# Patient Record
Sex: Male | Born: 1995 | Race: White | Hispanic: No | Marital: Single | Smoking: Never smoker
Health system: Southern US, Community
[De-identification: ages and names within clinical notes are randomized; demographics above are authoritative.]

## PROBLEM LIST (undated history)

## (undated) DIAGNOSIS — A389 Scarlet fever, uncomplicated: Secondary | ICD-10-CM

## (undated) HISTORY — DX: Scarlet fever, uncomplicated: A38.9

---
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
## 2018-05-31 ENCOUNTER — Encounter: Payer: Self-pay | Admitting: *Deleted

## 2018-05-31 ENCOUNTER — Other Ambulatory Visit: Payer: Self-pay

## 2018-05-31 ENCOUNTER — Emergency Department: Payer: No Typology Code available for payment source

## 2018-05-31 ENCOUNTER — Emergency Department
Admission: EM | Admit: 2018-05-31 | Discharge: 2018-05-31 | Disposition: A | Discharge status: Home / Self Care | Payer: No Typology Code available for payment source | Attending: Student in an Organized Health Care Education/Training Program | Admitting: Student in an Organized Health Care Education/Training Program

## 2018-05-31 DIAGNOSIS — T754XXA Electrocution, initial encounter: Secondary | ICD-10-CM

## 2018-05-31 DIAGNOSIS — W868XXA Exposure to other electric current, initial encounter: Secondary | ICD-10-CM | POA: Diagnosis not present

## 2018-05-31 DIAGNOSIS — Y990 Civilian activity done for income or pay: Secondary | ICD-10-CM | POA: Diagnosis not present

## 2018-05-31 DIAGNOSIS — Y9289 Other specified places as the place of occurrence of the external cause: Secondary | ICD-10-CM | POA: Insufficient documentation

## 2018-05-31 DIAGNOSIS — Y9389 Activity, other specified: Secondary | ICD-10-CM | POA: Insufficient documentation

## 2018-05-31 LAB — CBC
HCT: 45.1 % (ref 40.0–52.0)
HEMOGLOBIN: 15.6 g/dL (ref 13.0–18.0)
MCH: 30.6 pg (ref 26.0–34.0)
MCHC: 34.7 g/dL (ref 32.0–36.0)
MCV: 88.2 fL (ref 80.0–100.0)
PLATELETS: 343 10*3/uL (ref 150–440)
RBC: 5.12 MIL/uL (ref 4.40–5.90)
RDW: 12.5 % (ref 11.5–14.5)
WBC: 11.9 10*3/uL — ABNORMAL HIGH (ref 3.8–10.6)

## 2018-05-31 LAB — BASIC METABOLIC PANEL
ANION GAP: 7 (ref 5–15)
BUN: 15 mg/dL (ref 6–20)
CALCIUM: 9.4 mg/dL (ref 8.9–10.3)
CO2: 27 mmol/L (ref 22–32)
CREATININE: 0.86 mg/dL (ref 0.61–1.24)
Chloride: 106 mmol/L (ref 98–111)
Glucose, Bld: 115 mg/dL — ABNORMAL HIGH (ref 70–99)
Potassium: 4 mmol/L (ref 3.5–5.1)
Sodium: 140 mmol/L (ref 135–145)

## 2018-05-31 LAB — TROPONIN I

## 2018-05-31 NOTE — ED Provider Notes (Signed)
[facility] Emergency Department Provider Note    First MD Initiated Contact with Patient 05/31/18 1601     (approximate)  I have reviewed the triage vital signs and the nursing notes.   HISTORY  Chief Complaint Electric Shock    HPI Ernest Benitez is a 22 y.o. male he sustained a shock injury while completing the surgical tractor under his workstation at work today.  States it was not working and it was plugged to him but the switch was also employed and felt a shocking sensation to his right hand.  He did not lose consciousness.  States he did have some chest pain after this but denies any symptoms at this time.  Has also had some intermittent tingling in his left arm.  Denies any history of heart disease.  No numbness or tingling.  No head injury.  No nausea or vomiting.  No past medical history on file. No family history on file.  There are no active problems to display for this patient.     Prior to Admission medications   Not on File    Allergies Patient has no known allergies.    Social History Social History   Tobacco Use  . Smoking status: Never Smoker  . Smokeless tobacco: Never Used  Substance Use Topics  . Alcohol use: Yes  . Drug use: Never    Review of Systems Patient denies headaches, rhinorrhea, blurry vision, numbness, shortness of breath, chest pain, edema, cough, abdominal pain, nausea, vomiting, diarrhea, dysuria, fevers, rashes or hallucinations unless otherwise stated above in HPI. ____________________________________________   PHYSICAL EXAM:  VITAL SIGNS: Vitals:   05/31/18 1518  BP: (!) 146/81  Pulse: 100  Resp: 18  Temp: 98.8 F (37.1 C)  SpO2: 96%    Constitutional: Alert and oriented. Well appearing and in no acute distress. Eyes: Conjunctivae are normal.  Head: Atraumatic. Nose: No congestion/rhinnorhea. Mouth/Throat: Mucous membranes are moist.   Neck: Painless ROM.  Cardiovascular:   Good  peripheral circulation. Respiratory: Normal respiratory effort.  No retractions.  Gastrointestinal: Soft and nontender.  Musculoskeletal: No lower extremity tenderness .  No joint effusions. Neurologic:  Normal speech and language. No gross focal neurologic deficits are appreciated.  Skin:  Skin is warm, dry and intact. No rash noted. Psychiatric: Mood and affect are normal. Speech and behavior are normal. ____________________________________________   LABS (all labs ordered are listed, but only abnormal results are displayed)  Results for orders placed or performed during the hospital encounter of 05/31/18 (from the past 24 hour(s))  Basic metabolic panel     Status: Abnormal   Collection Time: 05/31/18  3:18 PM  Result Value Ref Range   Sodium 140 135 - 145 mmol/L   Potassium 4.0 3.5 - 5.1 mmol/L   Chloride 106 98 - 111 mmol/L   CO2 27 22 - 32 mmol/L   Glucose, Bld 115 (H) 70 - 99 mg/dL   BUN 15 6 - 20 mg/dL   Creatinine, Ser 2.950.86 0.61 - 1.24 mg/dL   Calcium 9.4 8.9 - 62.110.3 mg/dL   GFR calc non Af Amer >60 >60 mL/min   GFR calc Af Amer >60 >60 mL/min   Anion gap 7 5 - 15  CBC     Status: Abnormal   Collection Time: 05/31/18  3:18 PM  Result Value Ref Range   WBC 11.9 (H) 3.8 - 10.6 K/uL   RBC 5.12 4.40 - 5.90 MIL/uL   Hemoglobin 15.6 13.0 - 18.0  g/dL   HCT 16.1 09.6 - 04.5 %   MCV 88.2 80.0 - 100.0 fL   MCH 30.6 26.0 - 34.0 pg   MCHC 34.7 32.0 - 36.0 g/dL   RDW 40.9 81.1 - 91.4 %   Platelets 343 150 - 440 K/uL  Troponin I     Status: None   Collection Time: 05/31/18  3:18 PM  Result Value Ref Range   Troponin I <0.03 <0.03 ng/mL   ____________________________________________  EKG My review and personal interpretation at Time: 15:20   Indication: electrical injury  Rate: 100  Rhythm: sinus Axis: normal Other: normal intervals, no stemi ____________________________________________  RADIOLOGY  I personally reviewed all radiographic images ordered to evaluate for  the above acute complaints and reviewed radiology reports and findings.  These findings were personally discussed with the patient.  Please see medical record for radiology report.  ____________________________________________   PROCEDURES  Procedure(s) performed:  Procedures    Critical Care performed: no ____________________________________________   INITIAL IMPRESSION / ASSESSMENT AND PLAN / ED COURSE  Pertinent labs & imaging results that were available during my care of the patient were reviewed by me and considered in my medical decision making (see chart for details).  DDX: Atrial injury, dysrhythmia, fracture, electrolyte abnormality  Ernest Benitez is a 22 y.o. who presents to the ED with electrical injury as described above.  Patient well-appearing at this time.  EKG shows no evidence of preexcitation syndrome or dysrhythmia.  Blood work is reassuring.  Patient stable and appropriate for outpatient follow-up.      ____________________________________________   FINAL CLINICAL IMPRESSION(S) / ED DIAGNOSES  Final diagnoses:  Electrical injury in adult      NEW MEDICATIONS STARTED DURING THIS VISIT:  New Prescriptions   No medications on file     Note:  This document was prepared using Dragon voice recognition software and may include unintentional dictation errors.     Willy Eddy, MD 05/31/18 1630

## 2018-05-31 NOTE — ED Notes (Signed)
Spoke with supervisor Leontine LocketJenny Allred at [employer] who states that no additional testing required to file workers comp.

## 2018-05-31 NOTE — ED Triage Notes (Signed)
Pt states he was shocked when turning off a power strip at work at noon today.  Pt was shocked in right hand.  Reports now chest pain.  No n/v  Pt alert.  Speech clear.

## 2018-05-31 NOTE — ED Notes (Signed)
Pt alert and oriented X4, active, cooperative, pt in NAD. RR even and unlabored, color WNL.  Pt informed to return if any life threatening symptoms occur.  Discharge and followup instructions reviewed.  

## 2019-02-06 IMAGING — CR DG CHEST 2V
1 series · 2 of 2 positions shown · non-contrast
Comparison: None.

CLINICAL DATA: Chest pain following electrical shock

EXAM:
CHEST - 2 VIEW

[Series 1: dg chest 2 view · 0.14mm/px · 2 of 2 slices shown]
[im 1/2]
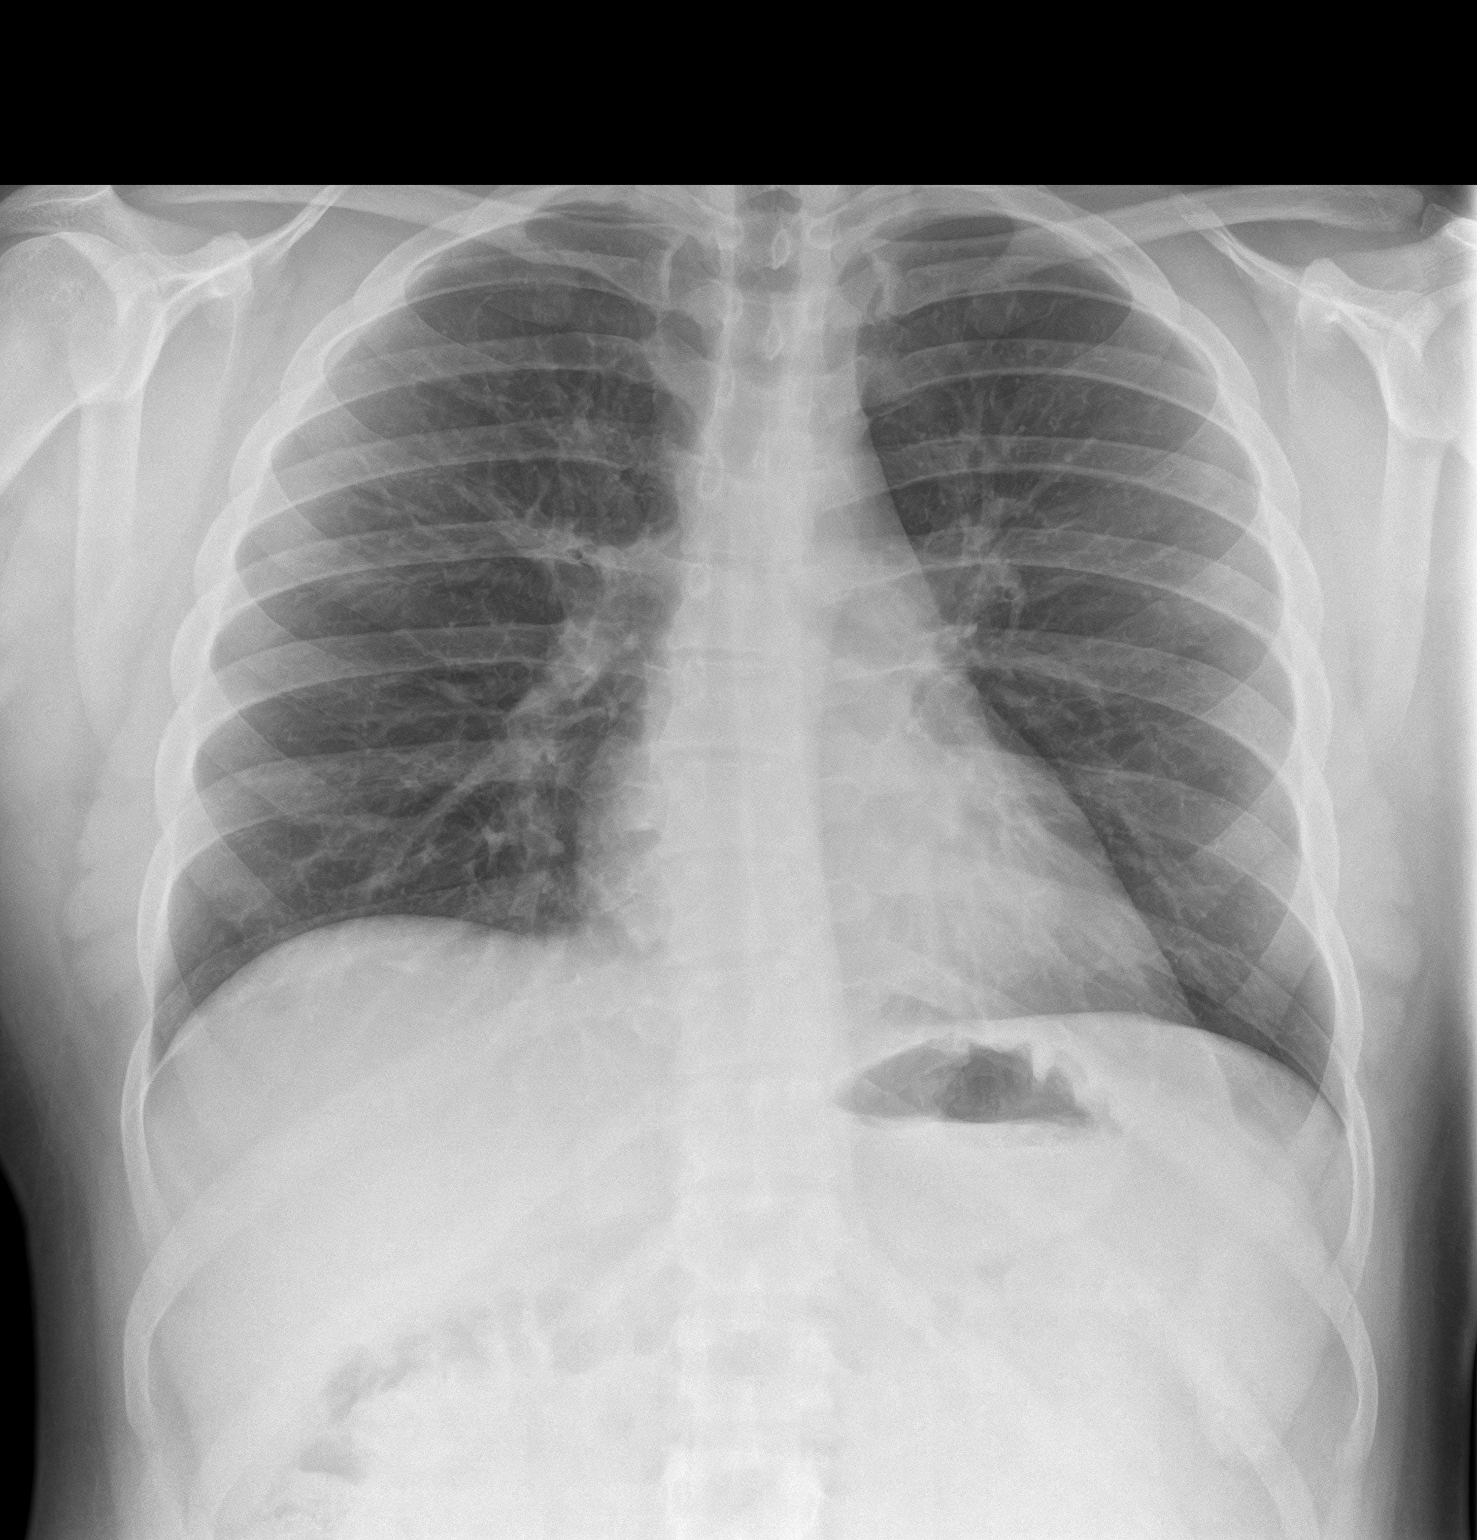
[im 2/2]
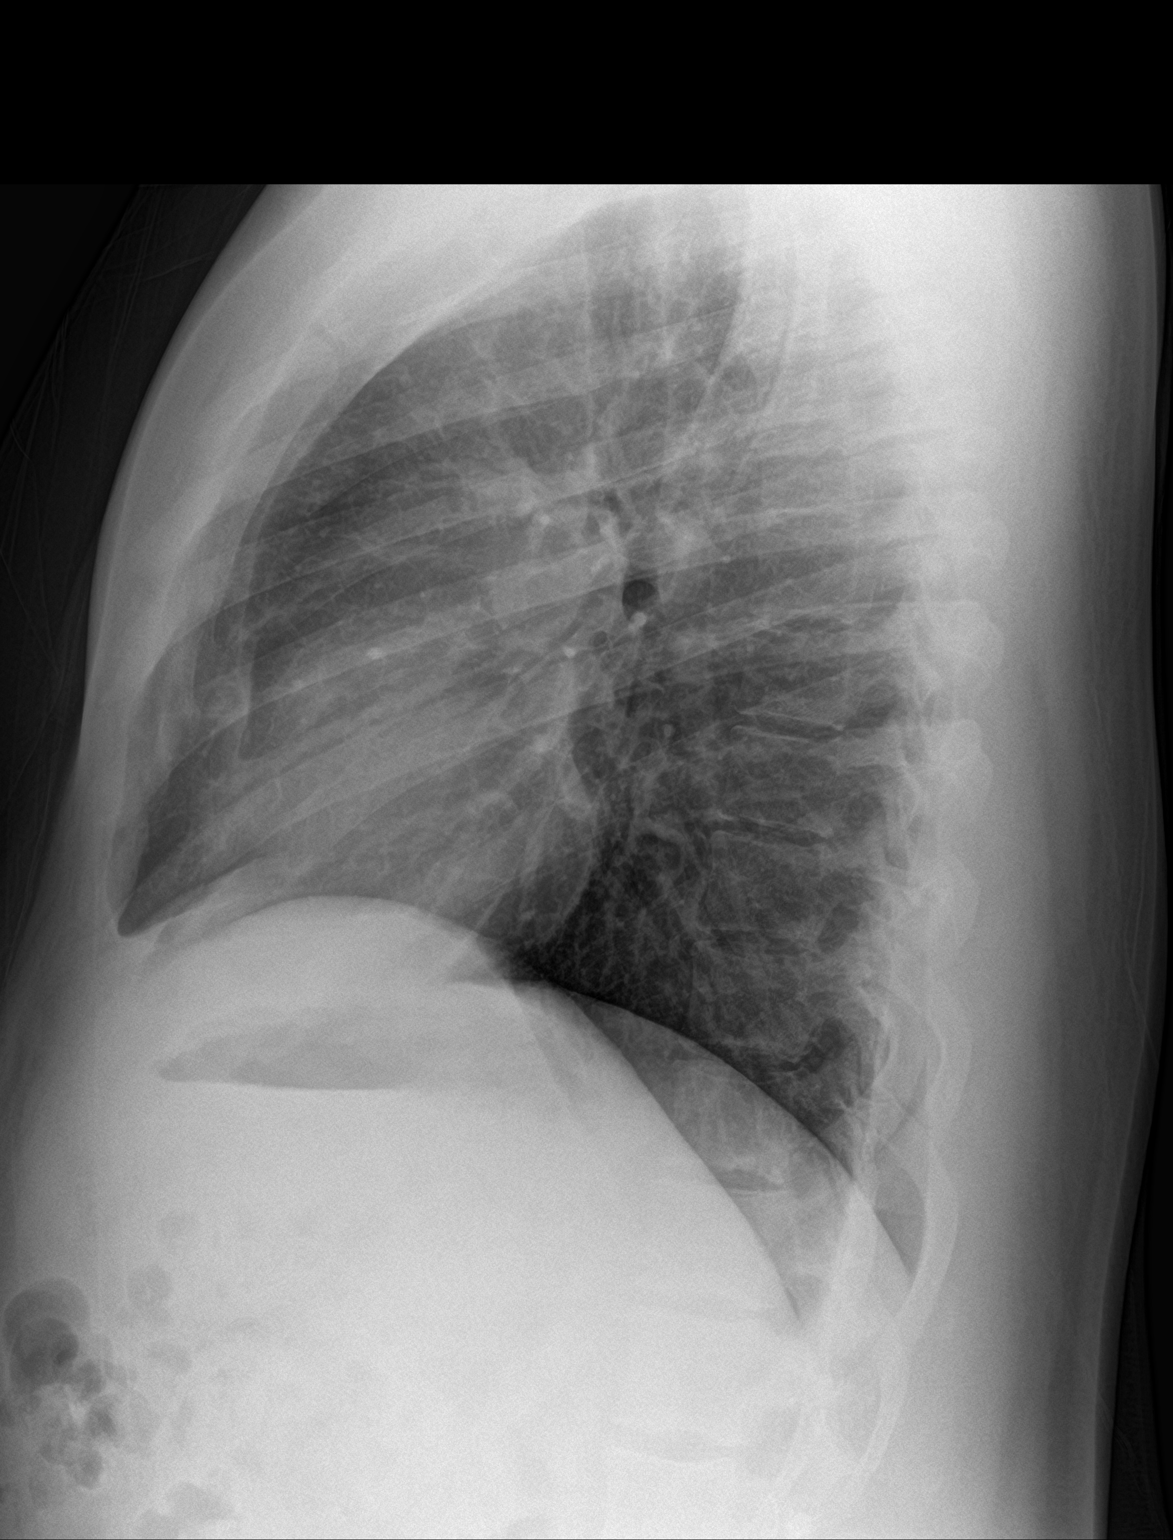

[2 of 2 positions shown; findings below may reference images not displayed]

FINDINGS: The heart size and mediastinal contours are within normal limits.
Both lungs are clear. The visualized skeletal structures are
unremarkable.
IMPRESSION: No acute abnormality noted.

## 2023-02-04 ENCOUNTER — Ambulatory Visit: Payer: Self-pay | Admitting: *Deleted

## 2023-02-04 NOTE — Telephone Encounter (Signed)
Spoke with patient and made an NP appt

## 2023-02-04 NOTE — Telephone Encounter (Signed)
Chief Complaint: abdominal pain requesting new patient appt Symptoms: abdominal pain sometimes severe with flare ups lasting 10 hours. Reports low abdominal pain  up to chest at times and sides. No normal BM x 2 years. Mostly loose. Mucus or like "gelatin"noted with wiping. No blood reported. Reports "foamy" at times. Pain can last 30 sec and come and go and causes severe pain doubled over. Did not report severe pain at this time. Some pain with urination at times.  Frequency: 2 years Pertinent Negatives: Patient denies chest pain no difficulty breathing reported no fever.  Disposition: '[x]'$ ED /'[]'$ Urgent Care (no appt availability in office) / '[]'$ Appointment(In office/virtual)/ '[]'$  [place] [facility]/ '[]'$ Home Care/ '[]'$ Refused Recommended Disposition /'[]'$ [facility]/ '[]'$  Follow-up with PCP Additional Notes:   Recommended ED due to no PCP and sx worsening. Transferred to [place] to assist patient with new patient scheduling appt.       Reason for Disposition  [1] MODERATE pain (e.g., interferes with normal activities) AND [2] pain comes and goes (cramps) AND [3] present > 24 hours  (Exception: Pain with Vomiting or Diarrhea - see that Guideline.)  Answer Assessment - Initial Assessment Questions 1. LOCATION: "Where does it hurt?"      Low abdominal area up to chest  2. RADIATION: "Does the pain shoot anywhere else?" (e.g., chest, back)     Sides and chest  3. ONSET: "When did the pain begin?" (Minutes, hours or days ago)      Started 2 years ago  4. SUDDEN: "Gradual or sudden onset?"     Worsening over time 5. PATTERN "Does the pain come and go, or is it constant?"    - If it comes and goes: "How long does it last?" "Do you have pain now?"     (Note: Comes and goes means the pain is intermittent. It goes away completely between bouts.)    - If constant: "Is it getting better, staying the same, or getting worse?"      (Note: Constant means the pain never goes away completely;  most serious pain is constant and gets worse.)      Happens everyday and flares up during the day comes and goes  6. SEVERITY: "How bad is the pain?"  (e.g., Scale 1-10; mild, moderate, or severe)    - MILD (1-3): Doesn't interfere with normal activities, abdomen soft and not tender to touch.     - MODERATE (4-7): Interferes with normal activities or awakens from sleep, abdomen tender to touch.     - SEVERE (8-10): Excruciating pain, doubled over, unable to do any normal activities.       Causes severe pain and flares up lasting 30 seconds or longer before going away. 7. RECURRENT SYMPTOM: "Have you ever had this type of stomach pain before?" If Yes, ask: "When was the last time?" and "What happened that time?"      Yes started 2 years ago has not been assessed for pain 8. CAUSE: "What do you think is causing the stomach pain?"     Not sure  9. RELIEVING/AGGRAVATING FACTORS: "What makes it better or worse?" (e.g., antacids, bending or twisting motion, bowel movement)     na 10. OTHER SYMPTOMS: "Do you have any other symptoms?" (e.g., back pain, diarrhea, fever, urination pain, vomiting)       Side pain some pain with urination nausea in am while using bathroom for BM no solid BMs within the last few months.  Noted mucus or  like gelatin when wiping foamy at times. No bleeding reported  Protocols used: Abdominal Pain - Male-A-AH

## 2023-03-23 NOTE — Progress Notes (Signed)
BP 112/72   Pulse 87   Temp 98.1 F (36.7 C) (Oral)   Resp 18   Ht 5\' 5"  (1.651 m)   Wt 194 lb 8 oz (88.2 kg)   SpO2 99%   BMI 32.37 kg/m    Subjective:    Patient ID: Ernest Benitez, male    DOB: [birth year], 27 y.o.   MRN: [id number]  HPI: Ernest Benitez is a 27 y.o. male  Chief Complaint  Patient presents with   Establish Care   Abdominal Pain    On and off for 3 years   Establish care: his last physical was 8 years ago.  Medical history includes none.  Family history includes cardiac issues.  Health Maintenance due for labs.   Abdominal pain:he says that he has been having abdominal pain about every morning for a few years, he also reports nausea and bms diarrhea with bristol scale 5-7.  He reports the pain feels like cramping and sharp pain.  He says it lasts 30-40 minutes.  He says that he will have three or four trips to the bathroom.  He says the rest of the day he is fine.  He reports his anxiety level is normal.  Will get stool sample, labs, start bentyl and zofran, referral placed to GI.      03/24/2023    2:33 PM  Depression screen PHQ 2/9  Decreased Interest 0  Down, Depressed, Hopeless 0  PHQ - 2 Score 0     Relevant past medical, surgical, family and social history reviewed and updated as indicated. Interim medical history since our last visit reviewed. Allergies and medications reviewed and updated.  Review of Systems  Constitutional: Negative for fever or weight change.  Respiratory: Negative for cough and shortness of breath.   Cardiovascular: Negative for chest pain or palpitations.  Gastrointestinal: positive for abdominal pain, diarrhea Musculoskeletal: Negative for gait problem or joint swelling.  Skin: Negative for rash.  Neurological: Negative for dizziness or headache.  No other specific complaints in a complete review of systems (except as listed in HPI above).      Objective:    BP 112/72   Pulse 87   Temp 98.1 F (36.7 C) (Oral)   Resp 18    Ht 5\' 5"  (1.651 m)   Wt 194 lb 8 oz (88.2 kg)   SpO2 99%   BMI 32.37 kg/m   Wt Readings from Last 3 Encounters:  03/24/23 194 lb 8 oz (88.2 kg)  05/31/18 208 lb (94.3 kg)    Physical Exam  Constitutional: Patient appears well-developed and well-nourished. Obese  No distress.  HEENT: head atraumatic, normocephalic, pupils equal and reactive to light, neck supple Cardiovascular: Normal rate, regular rhythm and normal heart sounds.  No murmur heard. No BLE edema. Pulmonary/Chest: Effort normal and breath sounds normal. No respiratory distress. Abdominal: Soft.  There is no tenderness. Psychiatric: Patient has a normal mood and affect. behavior is normal. Judgment and thought content normal.  Results for orders placed or performed during the hospital encounter of 05/31/18  Basic metabolic panel  Result Value Ref Range   Sodium 140 135 - 145 mmol/L   Potassium 4.0 3.5 - 5.1 mmol/L   Chloride 106 98 - 111 mmol/L   CO2 27 22 - 32 mmol/L   Glucose, Bld 115 (H) 70 - 99 mg/dL   BUN 15 6 - 20 mg/dL   Creatinine, Ser 6.29 0.61 - 1.24 mg/dL   Calcium 9.4 8.9 - 10.3  mg/dL   GFR calc non Af Amer >60 >60 mL/min   GFR calc Af Amer >60 >60 mL/min   Anion gap 7 5 - 15  CBC  Result Value Ref Range   WBC 11.9 (H) 3.8 - 10.6 K/uL   RBC 5.12 4.40 - 5.90 MIL/uL   Hemoglobin 15.6 13.0 - 18.0 g/dL   HCT 40.1 02.7 - 25.3 %   MCV 88.2 80.0 - 100.0 fL   MCH 30.6 26.0 - 34.0 pg   MCHC 34.7 32.0 - 36.0 g/dL   RDW 66.4 40.3 - 47.4 %   Platelets 343 150 - 440 K/uL  Troponin I  Result Value Ref Range   Troponin I <0.03 <0.03 ng/mL      Assessment & Plan:   Problem List Items Addressed This Visit   None Visit Diagnoses     Generalized abdominal pain    -  Primary   stool cultures, labs, referral to gi, start zofran and bentyl   Relevant Medications   dicyclomine (BENTYL) 20 MG tablet   ondansetron (ZOFRAN-ODT) 4 MG disintegrating tablet   Other Relevant Orders   Lipase   Ova and parasite  examination   Stool Giardia/Cryptosporidium   Salmonella/Shigella Cult, Campy EIA and Shiga Toxin reflex   Ambulatory referral to Gastroenterology   Diarrhea, unspecified type       stool cultures, labs, referral to gi, start zofran and bentyl   Relevant Medications   dicyclomine (BENTYL) 20 MG tablet   Other Relevant Orders   Ova and parasite examination   Stool Giardia/Cryptosporidium   Salmonella/Shigella Cult, Campy EIA and Shiga Toxin reflex   Ambulatory referral to Gastroenterology   Encounter to establish care       schedule cpe   Screening for diabetes mellitus       Relevant Orders   COMPLETE METABOLIC PANEL WITH GFR   Hemoglobin A1c   Screening for cholesterol level       Relevant Orders   Lipid panel   Screening for deficiency anemia       Relevant Orders   CBC with Differential/Platelet   Encounter for hepatitis C screening test for low risk patient       Relevant Orders   Hepatitis C antibody   Screening for HIV without presence of risk factors       Relevant Orders   HIV Antibody (routine testing w rflx)        Follow up plan: Return for cpe.

## 2023-03-24 ENCOUNTER — Encounter: Payer: Self-pay | Admitting: Nurse Practitioner

## 2023-03-24 ENCOUNTER — Ambulatory Visit: Payer: 59 | Admitting: Nurse Practitioner

## 2023-03-24 ENCOUNTER — Other Ambulatory Visit: Payer: Self-pay

## 2023-03-24 VITALS — BP 112/72 | HR 87 | Temp 98.1°F | Resp 18 | Ht 65.0 in | Wt 194.5 lb

## 2023-03-24 DIAGNOSIS — Z7689 Persons encountering health services in other specified circumstances: Secondary | ICD-10-CM

## 2023-03-24 DIAGNOSIS — Z131 Encounter for screening for diabetes mellitus: Secondary | ICD-10-CM | POA: Diagnosis not present

## 2023-03-24 DIAGNOSIS — Z130 Encounter for screening for diseases of the blood and blood-forming organs and certain disorders involving the immune mechanism: Secondary | ICD-10-CM

## 2023-03-24 DIAGNOSIS — R197 Diarrhea, unspecified: Secondary | ICD-10-CM

## 2023-03-24 DIAGNOSIS — Z13220 Encounter for screening for lipoid disorders: Secondary | ICD-10-CM

## 2023-03-24 DIAGNOSIS — Z114 Encounter for screening for human immunodeficiency virus [HIV]: Secondary | ICD-10-CM

## 2023-03-24 DIAGNOSIS — R1084 Generalized abdominal pain: Secondary | ICD-10-CM | POA: Diagnosis not present

## 2023-03-24 DIAGNOSIS — Z1159 Encounter for screening for other viral diseases: Secondary | ICD-10-CM

## 2023-03-24 LAB — CBC WITH DIFFERENTIAL/PLATELET
Absolute Monocytes: 697 cells/uL (ref 200–950)
Basophils Absolute: 43 cells/uL (ref 0–200)
Eosinophils Relative: 2.1 %
HCT: 42.9 % (ref 38.5–50.0)
MCHC: 34 g/dL (ref 32.0–36.0)
Neutrophils Relative %: 65.1 %
RDW: 12.2 % (ref 11.0–15.0)

## 2023-03-24 MED ORDER — DICYCLOMINE HCL 20 MG PO TABS: 20.0000 mg | ORAL_TABLET | Freq: Three times a day (TID) | ORAL | 1 refills | Status: AC | PRN

## 2023-03-24 MED ORDER — ONDANSETRON 4 MG PO TBDP: 4.0000 mg | ORAL_TABLET | Freq: Three times a day (TID) | ORAL | 0 refills | Status: AC | PRN

## 2023-03-25 LAB — CBC WITH DIFFERENTIAL/PLATELET
Basophils Relative: 0.5 %
Eosinophils Absolute: 179 cells/uL (ref 15–500)
Hemoglobin: 14.6 g/dL (ref 13.2–17.1)
Lymphs Abs: 2049 cells/uL (ref 850–3900)
MCH: 30.3 pg (ref 27.0–33.0)
MCV: 89 fL (ref 80.0–100.0)
MPV: 10.4 fL (ref 7.5–12.5)
Monocytes Relative: 8.2 %
Neutro Abs: 5534 cells/uL (ref 1500–7800)
Platelets: 358 10*3/uL (ref 140–400)
RBC: 4.82 10*6/uL (ref 4.20–5.80)
Total Lymphocyte: 24.1 %
WBC: 8.5 10*3/uL (ref 3.8–10.8)

## 2023-03-25 LAB — LIPID PANEL
Cholesterol: 159 mg/dL (ref <200)
HDL: 41 mg/dL (ref >=40)
LDL Cholesterol (Calc): 97 mg/dL (calc)
Non-HDL Cholesterol (Calc): 118 mg/dL (calc) (ref <130)
Total CHOL/HDL Ratio: 3.9 (calc) (ref <5.0)
Triglycerides: 114 mg/dL (ref <150)

## 2023-03-25 LAB — COMPLETE METABOLIC PANEL WITH GFR
AG Ratio: 2.2 (calc) (ref 1.0–2.5)
ALT: 19 U/L (ref 9–46)
AST: 17 U/L (ref 10–40)
Albumin: 4.6 g/dL (ref 3.6–5.1)
Alkaline phosphatase (APISO): 76 U/L (ref 36–130)
BUN: 10 mg/dL (ref 7–25)
CO2: 28 mmol/L (ref 20–32)
Calcium: 9.8 mg/dL (ref 8.6–10.3)
Chloride: 103 mmol/L (ref 98–110)
Creat: 0.76 mg/dL (ref 0.60–1.24)
Globulin: 2.1 g/dL (calc) (ref 1.9–3.7)
Glucose, Bld: 88 mg/dL (ref 65–99)
Potassium: 4.2 mmol/L (ref 3.5–5.3)
Sodium: 140 mmol/L (ref 135–146)
Total Bilirubin: 0.4 mg/dL (ref 0.2–1.2)
Total Protein: 6.7 g/dL (ref 6.1–8.1)
eGFR: 126 mL/min/{1.73_m2} (ref >=60)

## 2023-03-25 LAB — HIV ANTIBODY (ROUTINE TESTING W REFLEX): HIV 1&2 Ab, 4th Generation: NONREACTIVE

## 2023-03-25 LAB — HEMOGLOBIN A1C
Hgb A1c MFr Bld: 5.3 % of total Hgb (ref <5.7)
Mean Plasma Glucose: 105 mg/dL
eAG (mmol/L): 5.8 mmol/L

## 2023-03-25 LAB — HEPATITIS C ANTIBODY: Hepatitis C Ab: NONREACTIVE

## 2023-03-25 LAB — LIPASE: Lipase: 55 U/L (ref 7–60)

## 2023-04-19 ENCOUNTER — Encounter: Payer: 59 | Admitting: Nurse Practitioner

## 2023-04-19 NOTE — Progress Notes (Deleted)
Name: Ernest Benitez   MRN: [id number]    DOB: [birth year]   Date:04/19/2023       Progress Note  Subjective  Chief Complaint  No chief complaint on file.   HPI  Patient presents for annual CPE ***.   Diet: *** Exercise: *** Sleep: *** Last dental exam:*** Last eye exam: ***  Depression: phq 9 is {gen pos [id number]}    03/24/2023    2:33 PM  Depression screen PHQ 2/9  Decreased Interest 0  Down, Depressed, Hopeless 0  PHQ - 2 Score 0    Hypertension:  BP Readings from Last 3 Encounters:  03/24/23 112/72  05/31/18 (!) 142/93    Obesity: Wt Readings from Last 3 Encounters:  03/24/23 194 lb 8 oz (88.2 kg)  05/31/18 208 lb (94.3 kg)   BMI Readings from Last 3 Encounters:  03/24/23 32.37 kg/m  05/31/18 34.61 kg/m     Lipids:  Lab Results  Component Value Date   CHOL 159 03/24/2023   Lab Results  Component Value Date   HDL 41 03/24/2023   Lab Results  Component Value Date   LDLCALC 97 03/24/2023   Lab Results  Component Value Date   TRIG 114 03/24/2023   Lab Results  Component Value Date   CHOLHDL 3.9 03/24/2023   No results found for: "LDLDIRECT" Glucose:  Glucose, Bld  Date Value Ref Range Status  03/24/2023 88 65 - 99 mg/dL Final    Comment:    .            Fasting reference interval .   05/31/2018 115 (H) 70 - 99 mg/dL Final    Comment:    Please note change in reference range.    Flowsheet Row Office Visit from 03/24/2023 in [facility]  AUDIT-C Score 1        Single STD testing and prevention (HIV/chl/gon/syphilis): 03/24/2023 Hep C: 03/24/2023  Skin cancer: Discussed monitoring for atypical lesions Colorectal cancer: no concerns, does not qualify Prostate cancer: no concerns, does not qualify No results found for: "PSA"   Lung cancer:   Low Dose CT Chest recommended if Age 54-80 years, 30 pack-year currently smoking OR have quit w/in 15years. Patient does not qualify.   AAA:  The USPSTF  recommends one-time screening with ultrasonography in men ages 63 to 53 years who have ever smoked ECG:  06/02/2018  Vaccines:  HPV: up to at age 28 , ask insurance if age between 27-45  Shingrix: 28-28 yo and ask insurance if covered when patient above 20 yo Pneumonia:  educated and discussed with patient. Flu:  educated and discussed with patient.  Advanced Care Planning: A voluntary discussion about advance care planning including the explanation and discussion of advance directives.  Discussed health care proxy and Living will, and the patient was able to identify a health care proxy as ***.  Patient {DOES_DOES [id number]} have a living will at present time. If patient does have living will, I have requested they bring this to the clinic to be scanned in to their chart.  There are no problems to display for this patient.   No past surgical history on file.  Family History  Problem Relation Age of Onset   Heart disease Mother     Social History   Socioeconomic History   Marital status: Single    Spouse name: Not on file   Number of children: Not on file   Years of education: Not on file  Highest education level: Not on file  Occupational History   Not on file  Tobacco Use   Smoking status: Never    Passive exposure: Never   Smokeless tobacco: Never  Vaping Use   Vaping Use: Every day   Substances: Flavoring  Substance and Sexual Activity   Alcohol use: Yes   Drug use: Never   Sexual activity: Yes    Partners: Female  Other Topics Concern   Not on file  Social History Narrative   Community education officer at [employer] , 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   Social Determinants of Health   Financial Resource Strain: Not on file  Food Insecurity: Not on file  Transportation Needs: Not on file  Physical Activity: Not on file  Stress: Not on file  Social Connections: Not on file  Intimate Partner Violence: Not on file     Current Outpatient Medications:    dicyclomine (BENTYL) 20 MG tablet,  Take 1 tablet (20 mg total) by mouth 3 (three) times daily as needed for spasms (abd cramping)., Disp: 20 tablet, Rfl: 1   ondansetron (ZOFRAN-ODT) 4 MG disintegrating tablet, Take 1 tablet (4 mg total) by mouth every 8 (eight) hours as needed for nausea or vomiting., Disp: 10 tablet, Rfl: 0  No Known Allergies   ROS  Constitutional: Negative for fever or weight change.  Respiratory: Negative for cough and shortness of breath.   Cardiovascular: Negative for chest pain or palpitations.  Gastrointestinal: Negative for abdominal pain, no bowel changes.  Musculoskeletal: Negative for gait problem or joint swelling.  Skin: Negative for rash.  Neurological: Negative for dizziness or headache.  No other specific complaints in a complete review of systems (except as listed in HPI above).    Objective  There were no vitals filed for this visit.  There is no height or weight on file to calculate BMI.  Physical Exam Constitutional: Patient appears well-developed and well-nourished. No distress.  HENT: Head: Normocephalic and atraumatic. Ears: B TMs ok, no erythema or effusion; Nose: Nose normal. Mouth/Throat: Oropharynx is clear and moist. No oropharyngeal exudate.  Eyes: Conjunctivae and EOM are normal. Pupils are equal, round, and reactive to light. No scleral icterus.  Neck: Normal range of motion. Neck supple. No JVD present. No thyromegaly present.  Cardiovascular: Normal rate, regular rhythm and normal heart sounds.  No murmur heard. No BLE edema. Pulmonary/Chest: Effort normal and breath sounds normal. No respiratory distress. Abdominal: Soft. Bowel sounds are normal, no distension. There is no tenderness. no masses Musculoskeletal: Normal range of motion, no joint effusions. No gross deformities Neurological: he is alert and oriented to person, place, and time. No cranial nerve deficit. Coordination, balance, strength, speech and gait are normal.  Skin: Skin is warm and dry. No rash  noted. No erythema.  Psychiatric: Patient has a normal mood and affect. behavior is normal. Judgment and thought content normal.   Recent Results (from the past 2160 hour(s))  CBC with Differential/Platelet     Status: None   Collection Time: 03/24/23  3:06 PM  Result Value Ref Range   WBC 8.5 3.8 - 10.8 Thousand/uL   RBC 4.82 4.20 - 5.80 Million/uL   Hemoglobin 14.6 13.2 - 17.1 g/dL   HCT 16.1 09.6 - 04.5 %   MCV 89.0 80.0 - 100.0 fL   MCH 30.3 27.0 - 33.0 pg   MCHC 34.0 32.0 - 36.0 g/dL   RDW 40.9 81.1 - 91.4 %   Platelets 358 140 - 400 Thousand/uL   MPV  10.4 7.5 - 12.5 fL   Neutro Abs 5,534 1,500 - 7,800 cells/uL   Lymphs Abs 2,049 850 - 3,900 cells/uL   Absolute Monocytes 697 200 - 950 cells/uL   Eosinophils Absolute 179 15 - 500 cells/uL   Basophils Absolute 43 0 - 200 cells/uL   Neutrophils Relative % 65.1 %   Total Lymphocyte 24.1 %   Monocytes Relative 8.2 %   Eosinophils Relative 2.1 %   Basophils Relative 0.5 %  COMPLETE METABOLIC PANEL WITH GFR     Status: None   Collection Time: 03/24/23  3:06 PM  Result Value Ref Range   Glucose, Bld 88 65 - 99 mg/dL    Comment: .            Fasting reference interval .    BUN 10 7 - 25 mg/dL   Creat 1.61 0.96 - 0.45 mg/dL   eGFR 409 > OR = 60 WJ/XBJ/4.78G9   BUN/Creatinine Ratio SEE NOTE: 6 - 22 (calc)    Comment:    Not Reported: BUN and Creatinine are within    reference range. .    Sodium 140 135 - 146 mmol/L   Potassium 4.2 3.5 - 5.3 mmol/L   Chloride 103 98 - 110 mmol/L   CO2 28 20 - 32 mmol/L   Calcium 9.8 8.6 - 10.3 mg/dL   Total Protein 6.7 6.1 - 8.1 g/dL   Albumin 4.6 3.6 - 5.1 g/dL   Globulin 2.1 1.9 - 3.7 g/dL (calc)   AG Ratio 2.2 1.0 - 2.5 (calc)   Total Bilirubin 0.4 0.2 - 1.2 mg/dL   Alkaline phosphatase (APISO) 76 36 - 130 U/L   AST 17 10 - 40 U/L   ALT 19 9 - 46 U/L  Lipid panel     Status: None   Collection Time: 03/24/23  3:06 PM  Result Value Ref Range   Cholesterol 159 <200 mg/dL   HDL 41  > OR = 40 mg/dL   Triglycerides 562 <130 mg/dL   LDL Cholesterol (Calc) 97 mg/dL (calc)    Comment: Reference range: <100 . Desirable range <100 mg/dL for primary prevention;   <70 mg/dL for patients with CHD or diabetic patients  with > or = 2 CHD risk factors. Marland Kitchen LDL-C is now calculated using the Martin-Hopkins  calculation, which is a validated novel method providing  better accuracy than the Friedewald equation in the  estimation of LDL-C.  Horald Pollen et al. Lenox Ahr. 8657;846(96): 2061-2068  (http://education.[facility].com/faq/FAQ164)    Total CHOL/HDL Ratio 3.9 <5.0 (calc)   Non-HDL Cholesterol (Calc) 118 <130 mg/dL (calc)    Comment: For patients with diabetes plus 1 major ASCVD risk  factor, treating to a non-HDL-C goal of <100 mg/dL  (LDL-C of <29 mg/dL) is considered a therapeutic  option.   Hemoglobin A1c     Status: None   Collection Time: 03/24/23  3:06 PM  Result Value Ref Range   Hgb A1c MFr Bld 5.3 <5.7 % of total Hgb    Comment: For the purpose of screening for the presence of diabetes: . <5.7%       Consistent with the absence of diabetes 5.7-6.4%    Consistent with increased risk for diabetes             (prediabetes) > or =6.5%  Consistent with diabetes . This assay result is consistent with a decreased risk of diabetes. . Currently, no consensus exists regarding use of hemoglobin A1c for diagnosis of diabetes  in children. . According to American Diabetes Association (ADA) guidelines, hemoglobin A1c <7.0% represents optimal control in non-pregnant diabetic patients. Different metrics may apply to specific patient populations.  Standards of Medical Care in Diabetes(ADA). .    Mean Plasma Glucose 105 mg/dL   eAG (mmol/L) 5.8 mmol/L    Comment: . This test was performed on the Roche cobas c503 platform. Effective 09/06/22, a change in test platforms from the Abbott Architect to the Roche cobas c503 may have shifted HbA1c results compared to  historical results. Based on laboratory validation testing conducted at [facility], the Roche platform relative to the Abbott platform had an average increase in HbA1c value of < or = 0.3%. This difference is within accepted  variability established by the [facility]. Note that not all individuals will have had a shift in their results and direct comparisons between historical and current results for testing conducted on different platforms is not recommended.   Hepatitis C antibody     Status: None   Collection Time: 03/24/23  3:06 PM  Result Value Ref Range   Hepatitis C Ab NON-REACTIVE NON-REACTIVE    Comment: . HCV antibody was non-reactive. There is no laboratory  evidence of HCV infection. . In most cases, no further action is required. However, if recent HCV exposure is suspected, a test for HCV RNA (test code 16109) is suggested. . For additional information please refer to http://education.[facility].com/faq/FAQ22v1 (This link is being provided for informational/ educational purposes only.) .   HIV Antibody (routine testing w rflx)     Status: None   Collection Time: 03/24/23  3:06 PM  Result Value Ref Range   HIV 1&2 Ab, 4th Generation NON-REACTIVE NON-REACTIVE    Comment: HIV-1 antigen and HIV-1/HIV-2 antibodies were not detected. There is no laboratory evidence of HIV infection. Marland Kitchen PLEASE NOTE: This information has been disclosed to you from records whose confidentiality may be protected by state law.  If your state requires such protection, then the state law prohibits you from making any further disclosure of the information without the specific written consent of the person to whom it pertains, or as otherwise permitted by law. A general authorization for the release of medical or other information is NOT sufficient for this purpose. . For additional information please refer  to http://education.[facility].com/faq/FAQ106 (This link is being provided for informational/ educational purposes only.) . Marland Kitchen The performance of this assay has not been clinically validated in patients less than 6 years old. .   Lipase     Status: None   Collection Time: 03/24/23  3:06 PM  Result Value Ref Range   Lipase 55 7 - 60 U/L     Fall Risk:    03/24/2023    2:32 PM  Fall Risk   Falls in the past year? 0  Number falls in past yr: 0  Injury with Fall? 0   ***   Functional Status Survey:   ***   Assessment & Plan  There are no diagnoses linked to this encounter.   -Prostate cancer screening and PSA options (with potential risks and benefits of testing vs not testing) were discussed along with recent recs/guidelines. -USPSTF grade A and B recommendations reviewed with patient; age-appropriate recommendations, preventive care, screening tests, etc discussed and encouraged; healthy living encouraged; see AVS for patient education given to patient -Discussed importance of 150 minutes of physical activity weekly, eat two servings of fish weekly, eat one serving of tree nuts ( cashews, pistachios, pecans,  almonds.Marland Kitchen) every other day, eat 6 servings of fruit/vegetables daily and drink plenty of water and avoid sweet beverages.
